# Patient Record
Sex: Female | Born: 1960 | Race: Black or African American | Hispanic: No | Marital: Married | Smoking: Never smoker
Health system: Southern US, Community
[De-identification: ages and names within clinical notes are randomized; demographics above are authoritative.]

## PROBLEM LIST (undated history)

## (undated) DIAGNOSIS — I10 Essential (primary) hypertension: Secondary | ICD-10-CM

## (undated) HISTORY — PX: ABDOMINAL HYSTERECTOMY: SHX81

---

## 2016-01-26 NOTE — Nursing Note (Signed)
Nursing Discharge Summary - Text       Nursing Discharge Summary Entered On:  01/26/2016 20:33 EST    Performed On:  01/26/2016 20:32 EST by Dorette Grate, RN, Alwyn Ren               DC Information   Discharge To, Anticipated :   Home independently   Mode of Discharge :   Ambulatory   Dorette Grate RN, Alwyn Ren - 01/26/2016 20:32 EST   Education   Responsible Learner(s) :   No Data Available     Teaching Method :   Explanation, Printed materials   CORPOLONGO, RN, STEPHANIE B - 01/26/2016 20:32 EST   Post-Hospital Education Adult Grid   Importance of Follow-Up Visits :   Verbalizes understanding   Plan of Care :   Verbalizes understanding   When to Call Health Care Provider :   [facility] understanding   CORPOLONGO, RN, Alwyn Ren - 01/26/2016 20:32 EST   Medication Education Adult Grid   Med Dosage, Route, Scheduling :   Verbalizes understanding   Medication Precautions :   Verbalizes understanding   CORPOLONGO, RN, STEPHANIE B - 01/26/2016 20:32 EST

## 2016-01-26 NOTE — Nursing Note (Signed)
Orthostatics - Text       Orthostatics Entered On:  01/26/2016 20:00 EST    Performed On:  01/26/2016 19:57 EST by CORPOLONGO, RN, STEPHANIE B               Orthostatics   Systolic/Diastolic  Supine BP :   126 mmHg   Systolic/Diastolic  Supine BP :   82 mmHg   Pulse Supine :   105 bpm   Systolic/Diastolic  Standing BP :   140 mmHg   Systolic/Diastolic  Standing BP :   90 mmHg   Pulse Standing :   111 bpm   Systolic/Diastolic  Sitting BP :   135 mmHg   Systolic/Diastolic  Sitting BP :   90 mmHg   Pulse Sitting :   104 bpm (HI)    Comments :   Not lightheaded or dizzy when standing or sitting up.   Dorette Grate RN, STEPHANIE B - 01/26/2016 19:57 EST

## 2016-03-17 NOTE — Nursing Note (Signed)
Nursing Discharge Summary - Text       Nursing Discharge Summary Entered On:  03/17/2016 15:17 EDT    Performed On:  03/17/2016 15:17 EDT by CABE, RN, ADAM Z               DC Information   Discharge To, Anticipated :   Home with family support   Mode of Discharge :   Ambulatory   Transportation :   Private vehicle   Accompanied By :   Eyvonne MechanicFriend   CABE, RN, ADAM Z - 03/17/2016 15:17 EDT   Education   Responsible Learner(s) :   No Data Available     Barriers To Learning :   None evident   Teaching Method :   Demonstration, Explanation, Printed materials   CABE, RN, ADAM Z - 03/17/2016 15:17 EDT   Post-Hospital Education Adult Grid   Activity Expectations :   Verbalizes understanding   Diagnostic Results :   Verbalizes understanding   Disease Process :   Verbalizes understanding   Importance of Follow-Up Visits :   Verbalizes understanding   Invasive Line Care :   Verbalizes understanding   Plan of Care :   Verbalizes understanding   When to Call Health Care Provider :   Verbalizes understanding   CABE, RN, ADAM Z - 03/17/2016 15:17 EDT   Medication Education Adult Grid   Drug to Drug Interactions :   IT sales professionalVerbalizes understanding   Safety, Medication :   Verbalizes understanding   CABE, RN, ADAM Z - 03/17/2016 15:17 EDT   Additional Learner(s) Present :   Eyvonne MechanicFriend   CABE, RN, ADAM Z - 03/17/2016 15:17 EDT

## 2016-11-07 ENCOUNTER — Encounter (HOSPITAL_BASED_OUTPATIENT_CLINIC_OR_DEPARTMENT_OTHER): Payer: Self-pay | Admitting: *Deleted

## 2016-11-07 DIAGNOSIS — I10 Essential (primary) hypertension: Secondary | ICD-10-CM | POA: Insufficient documentation

## 2016-11-07 DIAGNOSIS — Z79899 Other long term (current) drug therapy: Secondary | ICD-10-CM | POA: Insufficient documentation

## 2016-11-07 DIAGNOSIS — R509 Fever, unspecified: Secondary | ICD-10-CM | POA: Diagnosis present

## 2016-11-07 DIAGNOSIS — J069 Acute upper respiratory infection, unspecified: Secondary | ICD-10-CM | POA: Insufficient documentation

## 2016-11-07 NOTE — ED Triage Notes (Signed)
Cough x 1 week.  No fever in 4-5 days.  Reports clear nasal drainage.

## 2016-11-08 ENCOUNTER — Emergency Department (HOSPITAL_BASED_OUTPATIENT_CLINIC_OR_DEPARTMENT_OTHER)

## 2016-11-08 ENCOUNTER — Emergency Department (HOSPITAL_BASED_OUTPATIENT_CLINIC_OR_DEPARTMENT_OTHER)
Admission: EM | Admit: 2016-11-08 | Discharge: 2016-11-08 | Disposition: A | Discharge status: Home / Self Care | Attending: Emergency Medicine | Admitting: Emergency Medicine

## 2016-11-08 DIAGNOSIS — J069 Acute upper respiratory infection, unspecified: Secondary | ICD-10-CM

## 2016-11-08 HISTORY — DX: Essential (primary) hypertension: I10

## 2016-11-08 MED ORDER — BENZONATATE 100 MG PO CAPS
100.0000 mg | ORAL_CAPSULE | Freq: Three times a day (TID) | ORAL | 0 refills | Status: AC | PRN
Start: 2016-11-08 — End: ?

## 2016-11-08 MED ORDER — MUPIROCIN CALCIUM 2 % EX CREA: 1.0000 "application " | TOPICAL_CREAM | Freq: Two times a day (BID) | CUTANEOUS | 0 refills | Status: AC

## 2016-11-08 NOTE — Discharge Instructions (Addendum)
You may apply Bactroban to the small lesion next to your nose twice a day for the next week. Please do not scratch this area, squeeze this area. You may alternate Tylenol and Motrin for fever and pain. Your chest x-ray showed no sign of pneumonia. This is likely a virus and will continue to run its course. You do not need antibiotics at this time.

## 2016-11-08 NOTE — ED Provider Notes (Signed)
By signing my name below, I, Doreatha Martinva Mathews, attest that this documentation has been prepared under the direction and in the presence of Malkia Nippert N Hannah Crill, DO. Electronically Signed: Doreatha MartinEva Mathews, ED Scribe. 11/08/16. 12:27 AM.   TIME SEEN: 12:21 AM   CHIEF COMPLAINT:  Chief Complaint  Patient presents with  . Fever     HPI:  HPI Comments: Gabriella Ross is a 55 y.o. female with h/o HTN who presents to the Emergency Department complaining of persistent, intermittent productive cough with green phlegm x 4 days with associated sore throat, rhinorrhea, subjective fever. Pt also complains of an area of irritation and itching on the left side of her nose, which she attributes to blowing her nose frequently. Pt states she has taken 81 mg ASA, but no ibuprofen or Tylenol today. No worsening factors noted. Pt received a flu shot this year. No known sick contacts with similar symptoms.  She denies vomiting, diarrhea. No headache, neck pain or neck stiffness. No rash.  ROS: See HPI Constitutional: + subjective fever  Eyes: no drainage  ENT: + runny nose, sore throat   Cardiovascular:  no chest pain  Resp: no SOB. + cough  GI: no vomiting GU: no dysuria Integumentary: no rash  Allergy: no hives  Musculoskeletal: no leg swelling  Neurological: no slurred speech ROS otherwise negative  PAST MEDICAL HISTORY/PAST SURGICAL HISTORY:  Past Medical History:  Diagnosis Date  . Hypertension     MEDICATIONS:  Prior to Admission medications   Medication Sig Start Date End Date Taking? Authorizing Provider  amLODipine (NORVASC) 10 MG tablet Take 5 mg by mouth daily.   Yes Historical Provider, MD  pravastatin (PRAVACHOL) 20 MG tablet Take 20 mg by mouth daily.   Yes Historical Provider, MD  spironolactone (ALDACTONE) 25 MG tablet Take 25 mg by mouth daily.   Yes Historical Provider, MD    ALLERGIES:  Allergies  Allergen Reactions  . Penicillins     SOCIAL HISTORY:  Social History  Substance Use  Topics  . Smoking status: Never Smoker  . Smokeless tobacco: Never Used  . Alcohol use No    FAMILY HISTORY: History reviewed. No pertinent family history.  EXAM: BP 150/89 (BP Location: Left Arm)   Pulse 82   Temp 97.8 F (36.6 C) (Oral)   Resp 18   Ht 5\' 8"  (1.727 m)   Wt 200 lb (90.7 kg)   SpO2 99%   BMI 30.41 kg/m  CONSTITUTIONAL: Alert and oriented and responds appropriately to questions. Well-appearing; well-nourished HEAD: Normocephalic EYES: Conjunctivae clear, PERRL, EOMI ENT: normal nose; no rhinorrhea; moist mucous membranes. No pharyngeal erythema or petechiae, no tonsillar hypertrophy or exudate, no uvular deviation, no unilateral swelling, no trismus or drooling, no muffled voice, normal phonation, no stridor, no dental caries present, no drainable dental abscess noted, no Ludwig's angina, tongue sits flat in the bottom of the mouth, no angioedema, no facial erythema or warmth, no facial swelling; no pain with movement of the neck; 1 cm scabbed area to the left side of the nose with minimal surrounding erythema and no warmth, fluctuance, drainage, induration NECK: Supple, no meningismus, no nuchal rigidity, no LAD  CARD: RRR; S1 and S2 appreciated; no murmurs, no clicks, no rubs, no gallops RESP: Normal chest excursion without splinting or tachypnea; breath sounds clear and equal bilaterally; no wheezes, no rhonchi, no rales, no hypoxia or respiratory distress, speaking full sentences ABD/GI: Normal bowel sounds; non-distended; soft, non-tender, no rebound, no guarding, no peritoneal signs,  no hepatosplenomegaly BACK:  The back appears normal and is non-tender to palpation, there is no CVA tenderness EXT: Normal ROM in all joints; non-tender to palpation; no edema; normal capillary refill; no cyanosis, no calf tenderness or swelling    SKIN: Normal color for age and race; warm; no rash NEURO: Moves all extremities equally, sensation to light touch intact diffusely,  cranial nerves II through XII intact, normal speech PSYCH: The patient's mood and manner are appropriate. Grooming and personal hygiene are appropriate.  MEDICAL DECISION MAKING: Patient here with subjective fever, cough the past several days. They were concerned because patient is here visiting family and her grandson recently had heart surgery and is still in the hospital. Patient's daughter does not want to get sick and give symptoms to her grandson. Will obtain chest x-ray to evaluate for pneumonia but suspect viral illness. This have a small lesion noted to the left face but no obvious sign of abscess or facial cellulitis. This does not look like shingles. No other rash or lesions noted. Will put patient on topical Bactroban. She denies any pain at this time.  ED PROGRESS: Patient's chest x-ray shows no sign of infiltrate, edema or pneumothorax. No pleural effusion. I feel she is safe to be discharged home. Suspect viral illness. Recommended alternating Tylenol and Motrin for fever, pain. We'll discharge with Bactroban to place on this lesion next to her nose the next week twice a day. Discussed with patient and daughter return precautions. Recommended good hand hygiene at home and avoiding grandchild until symptoms have resolved.  At this time, I do not feel there is any life-threatening condition present. I have reviewed and discussed all results (EKG, imaging, lab, urine as appropriate) and exam findings with patient/family. I have reviewed nursing notes and appropriate previous records.  I feel the patient is safe to be discharged home without further emergent workup and can continue workup as an outpatient as needed. Discussed usual and customary return precautions. Patient/family verbalize understanding and are comfortable with this plan.  Outpatient follow-up has been provided. All questions have been answered.    I personally performed the services described in this documentation, which was  scribed in my presence. The recorded information has been reviewed and is accurate.    Layla MawKristen N Tracy Kinner, DO 11/08/16 [phone]

## 2016-11-08 NOTE — ED Notes (Signed)
Pt given d/c instructions as per chart. Verbalizes understanding. No questions. Rx x 2. 

## 2017-05-08 IMAGING — CR DG CHEST 2V
2 series · 2 of 2 positions shown · non-contrast
Comparison: None.

CLINICAL DATA: Acute onset of cough and congestion. Burning at the
chest. Slight headache and dizziness. Initial encounter.

EXAM:
CHEST  2 VIEW

[w chest pa]
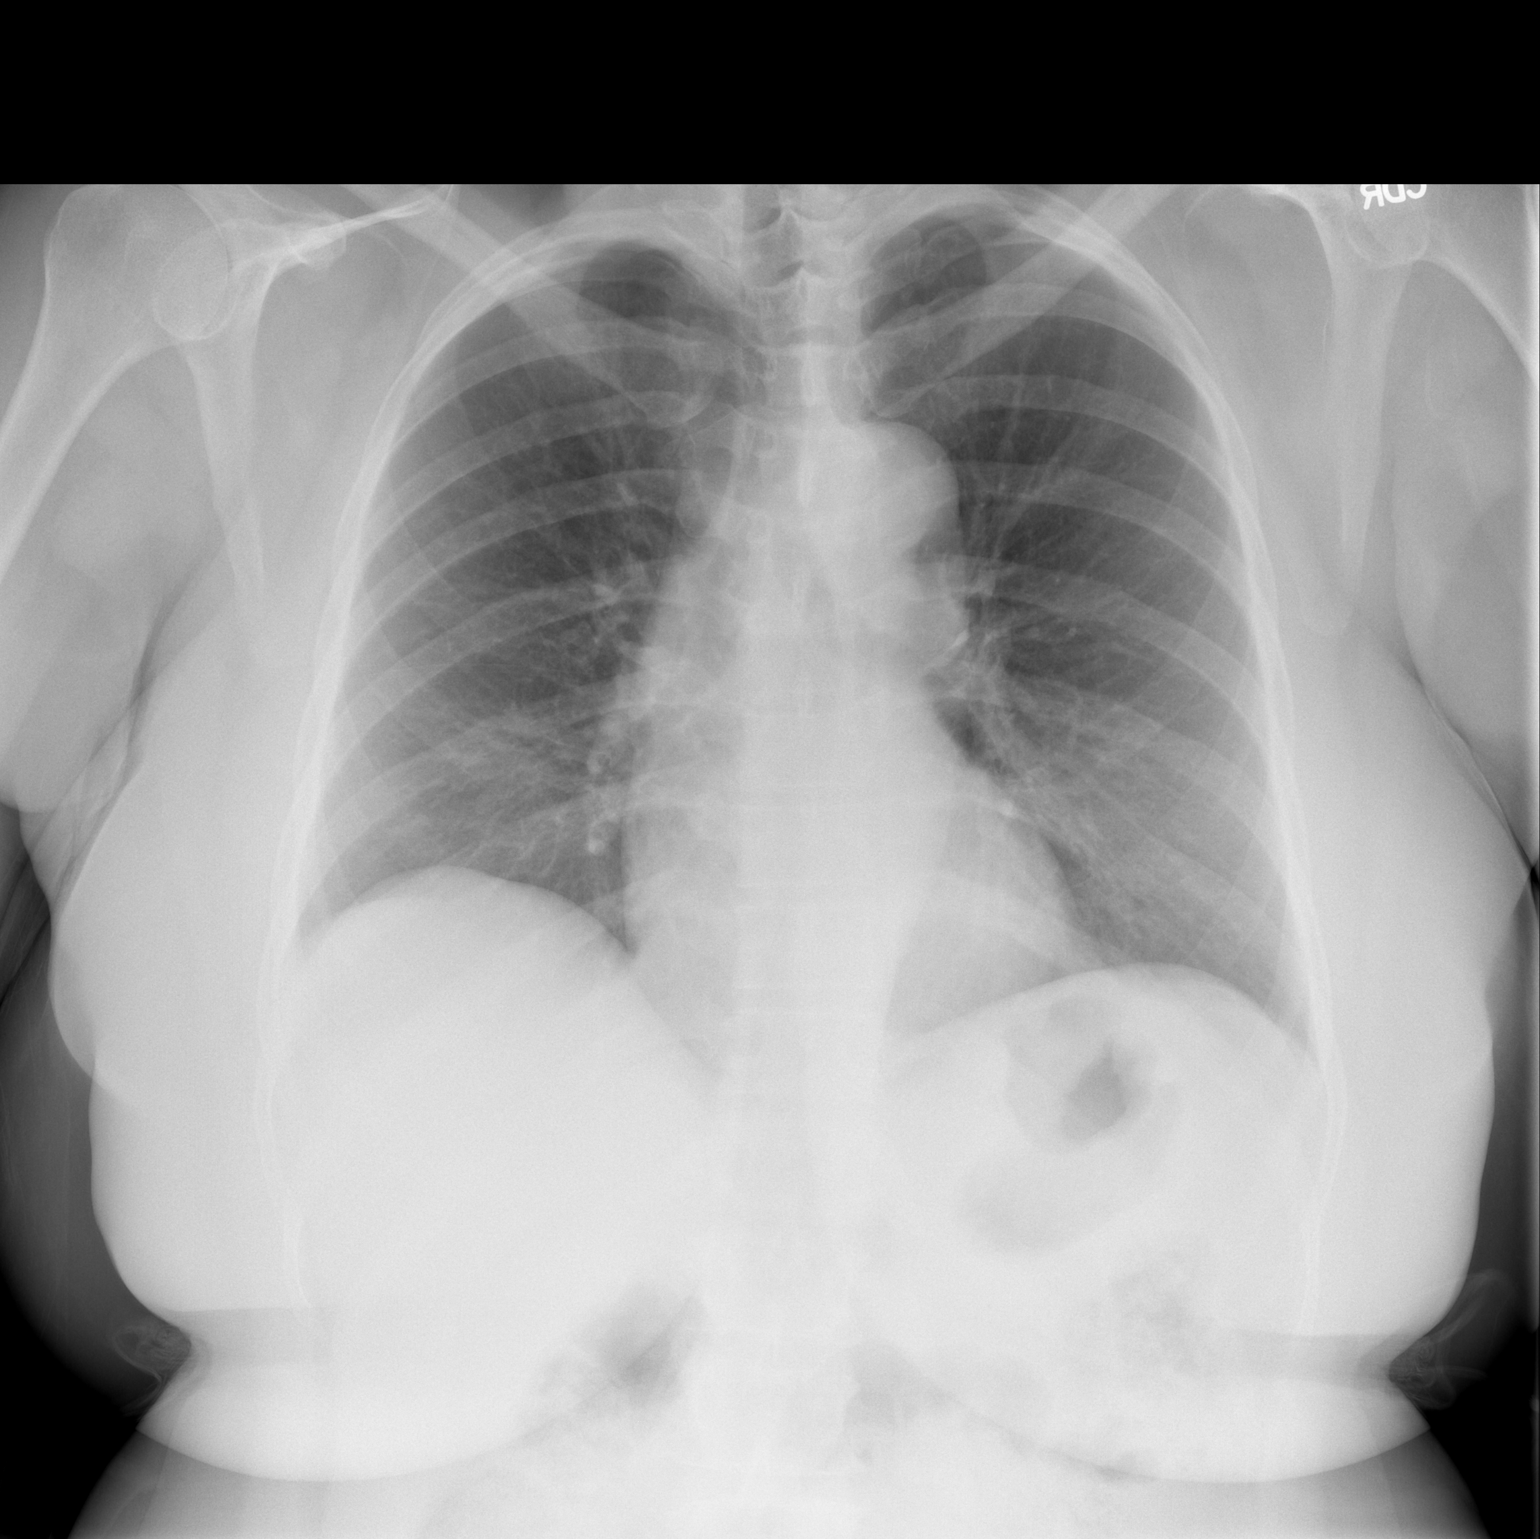

[w chest lat]
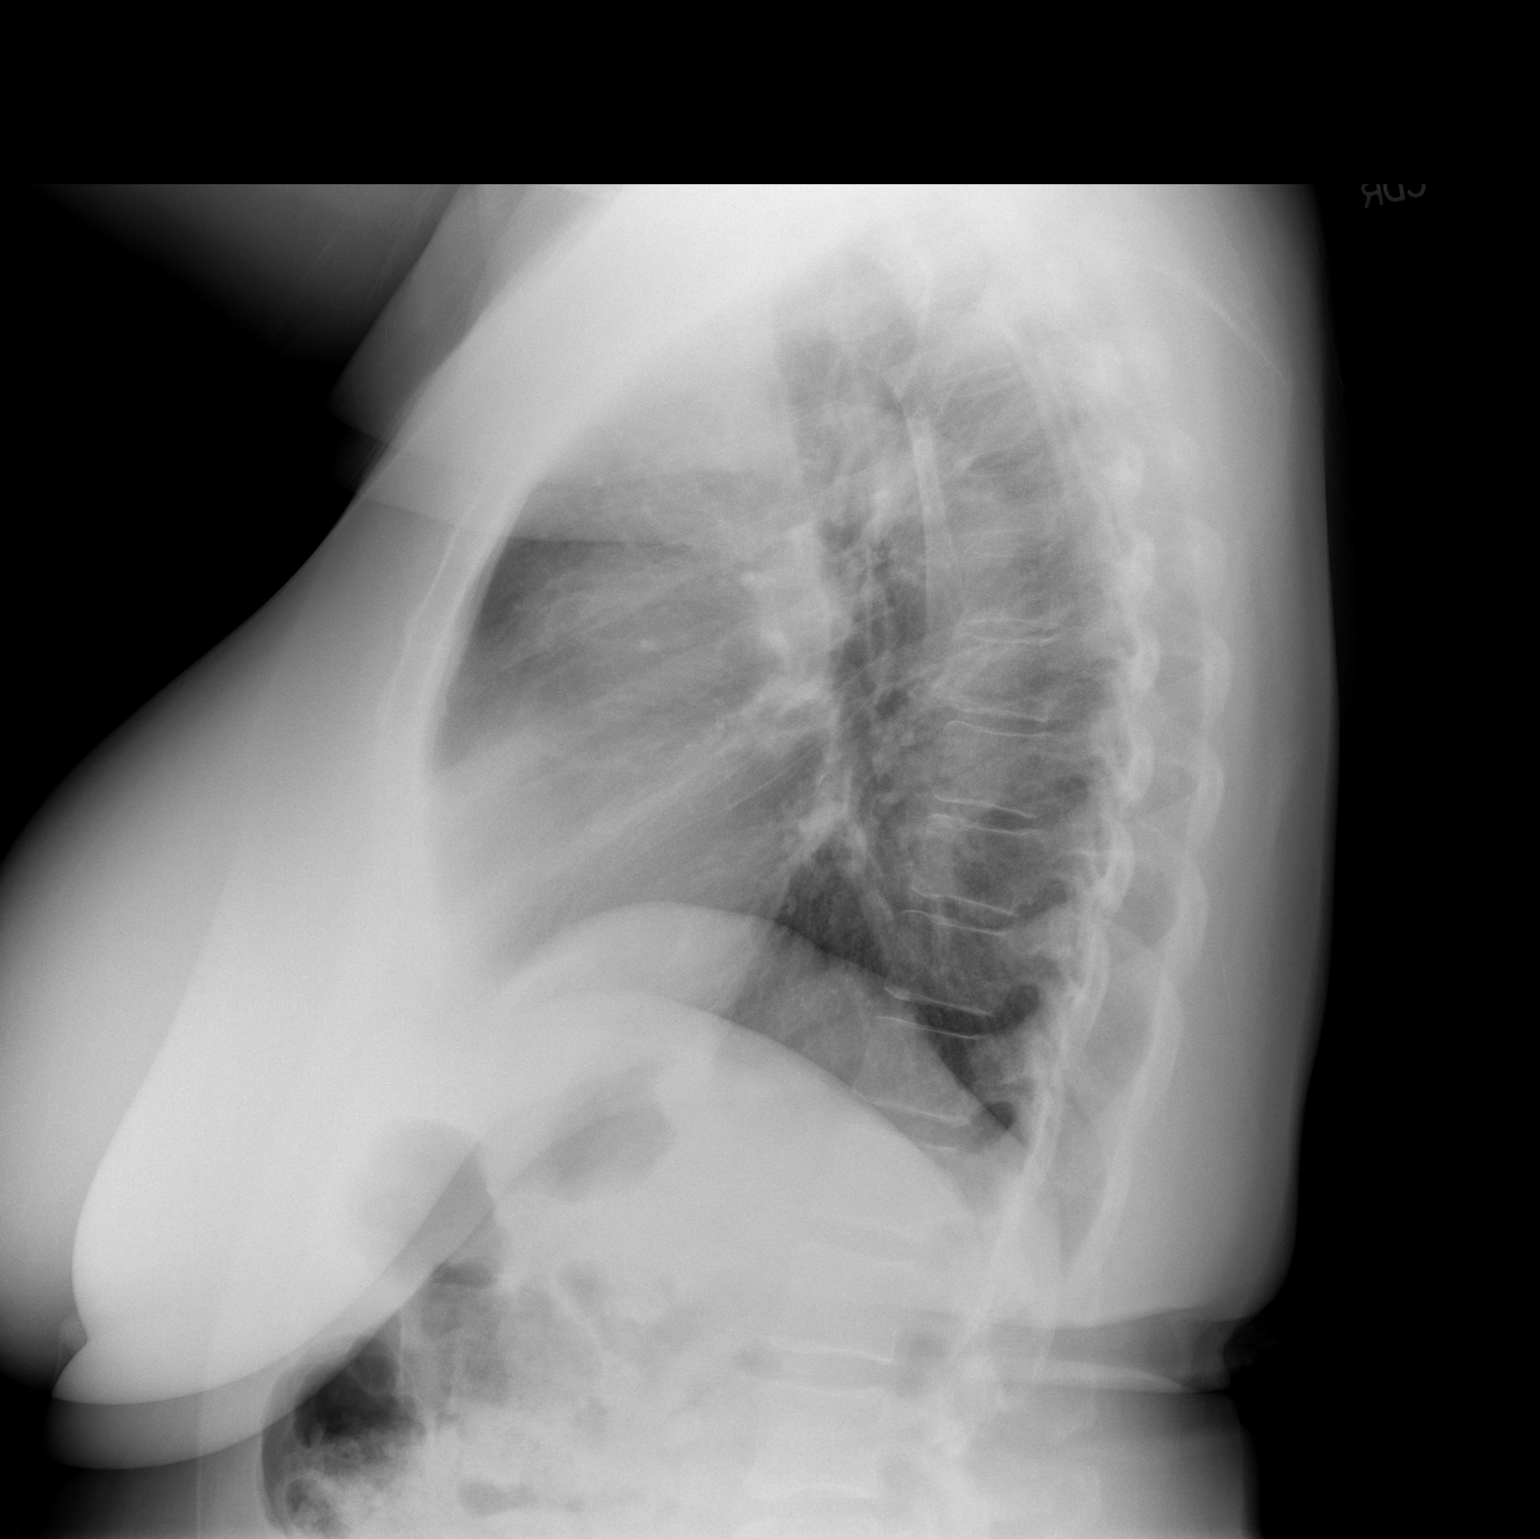

[2 of 2 positions shown; findings below may reference images not displayed]

FINDINGS: The lungs are well-aerated and clear. There is no evidence of focal
opacification, pleural effusion or pneumothorax.

The heart is normal in size; the mediastinal contour is within
normal limits. No acute osseous abnormalities are seen.
IMPRESSION: No acute cardiopulmonary process seen.

## 2023-06-01 ENCOUNTER — Encounter: Payer: PRIVATE HEALTH INSURANCE | Attending: Orthopaedic Surgery

## 2023-07-04 NOTE — Telephone Encounter (Signed)
Patient would like a call back about this appt as she was not made aware of it. Thank you

## 2023-07-12 ENCOUNTER — Encounter: Payer: PRIVATE HEALTH INSURANCE | Attending: Student in an Organized Health Care Education/Training Program

## 2023-07-29 LAB — HEMOGLOBIN A1C: Hemoglobin A1C, External: 5.6 % (ref 4.3–5.6)

## 2023-08-22 ENCOUNTER — Ambulatory Visit
Admit: 2023-08-22 | Discharge: 2023-08-22 | Payer: PRIVATE HEALTH INSURANCE | Attending: Student in an Organized Health Care Education/Training Program

## 2023-08-22 DIAGNOSIS — M533 Sacrococcygeal disorders, not elsewhere classified: Secondary | ICD-10-CM

## 2023-08-22 NOTE — Progress Notes (Signed)
 Carmen Casa, MD  Orthopaedic Surgery & Sports Medicine  [id number]    Name: Carmen Miller  DOB: [birth year]  MRN: [id number]    [facility] SURGERY CLINIC NOTE    Chief Complaint   Patient presents with    Injury     IME: Right Hip       HPI   08/22/23  Carmen Miller is a 62 y.o. female presenting for IME.    Record Review:   All supplied records were reviewed.  Pertinent information summarized below.  Patient was referred for evaluation of her right hip.  Initial injury occurred in November 06, 2022.  She was making a delivery on a porch and there was a rotten board and her right leg went through the board up to her groin.  At that time, per records she noted pain to the low back, left wrist, left knee.  She went to a med care facility where x-rays were obtained.  Patient was started on naproxen and initiated in physical therapy.  She feels that the physical therapy did not help.  She did eventually get some improvement in the knee pain in the wrist pain but as of March 2024 continued to note right low back pain sometimes radiating to the posterior right thigh.  She has been on work restrictions.  Driving reported to be painful due to prolonged sitting and putting pressure on the right side.  Of note, there is indication for records of a prior 2017 work accident involving injuries to her low back, neck, right hip.  MRI of the lumbar spine and of the right hip open obtained.    History obtained from the patient today:  Patient today states it is not my hip that hurts, I do not know why people keep saying that and points to the source of her pain of her right low back.  She states that the pain has been here for quite some time and has not improved with the treatments rendered thus far including naproxen and gabapentin and a course of physical therapy.  No radiation of pain down the leg. She states that bending activities are the worst with pain in that right low back, but she is able to ambulate for a few hours  a day and walking is better tolerated than bending or prolonged sitting.    No current outpatient medications on file.    No past medical history on file.     No past surgical history on file.    No family history on file.           PHYSICAL EXAM   Right hip:  Skin is intact.   Range of motion includes forward flexion to 110, external rotation to 30, internal rotation to 20.   No pain with forward flexion.   No pain with FADIR.   No pain with FABER.   No snapping hip appreciated.   No pain with resisted hip flexion or resisted adduction.   Strength is 5 out of 5 in hip flexion, abduction, adduction.   Neurovascularly intact distally.     SI Joint Exam:  Positive SI joint tenderness  Positive Fortin's finger test  Positive Gaenslen's test  Negative SI compression test  Negative SI distraction test  Patrick's test positive for SI pain      IMAGING     First Name: Carmen   Last Name: Miller   Date of Birth: 1961/04/19   Gender: F   MRN: [id number]  Exam Date: 25-Apr-2023 8:36:07 AM   Exam Description: MR Right Hip Without Contrast   Referring Physician: Ancel Miller   Reading Physician(s):    Report Date: 25-Apr-2023 8:30:00 AM   Completion Status: COMPLETE   Verification Status: VERIFIED   PATIENT NAME: Carmen, Miller  DOB: 1961-08-31  MRN: [id number]  PHONE:[phone]  PHYSICIAN: Miller Ancel, MD  EXAM DATE: 04/25/2023  EXAM: MR Right Hip Without Contrast  REASON FOR EXAM: M25.551 - Pain in right hip  *-*-*  TECHNIQUE: Multiplanar multisequence MRI images of the right hip, 3.0 Tesla  MRI.    COMPARISON: None.    FINDINGS: The femoral heads are rounded in shape and seated in the  acetabulum. No femoral neck stress fracture. No avascular necrosis of  either hip.    Mild to moderate primary osteoarthrosis (OA) of both hips. Low grade  chondromalacia of the anterolateral margin of the right acetabulum. There  are degenerative changes of the pubic symphysis.    Mild degenerative change of the sacroiliac joints,  partially seen. No  significant bone marrow edema the pelvis to suggest stress fracture.    The lateral center edge angle of the right hip is normal at 32 degrees. 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    There is fraying of the superior and anterosuperior aspect of the right  acetabular labrum. No displaced labral tear.    Mild tendinosis of the right gluteus minimus and medius tendons. Trace right  greater trochanteric bursitis. Intact iliopsoas and rectus femoris tendon.    Tendinosis and age indeterminate low grade intrasubstance partial-thickness  tear both hamstring tendons.    The intrapelvic structures are only partially seen. There has been a prior  hysterectomy.      IMPRESSION:    1. Mild to moderate primary OA of both hips. Degenerative fraying of the  superior and anterosuperior aspect of the right acetabular labrum. No  displaced labral tear.    2. Tendinosis of the right gluteus minimus and medius tendons. Trace right  greater trochanteric bursitis.    3. Tendinosis and age indeterminate low grade intrasubstance  partial-thickness tear of both the hamstring tendons.    4. There are degenerative change of the pubic symphysis and mild  degenerative changes of both sacroiliac joints.    *-*-*   THIS IS AN ELECTRONICALLY VERIFIED REPORT   04/26/2023 8:46 AM: [name], DO  [name], DO  [phone]  DEO/deo  DD: 04/26/2023 08:27 am  DT: 04/26/2023 08:46 am  Accession #: [id number]  Radiology  COPY PAGE 1 of 1  Finding         First Name: Carmen   Last Name: Miller   Date of Birth: 1961/10/10   Gender: F   MRN: [id number]   Exam Date: 04-Mar-2023 4:15:20 PM   Exam Description: MR-Lumbar Spine without contrast   Referring Physician: Ancel Miller   Reading Physician(s):    Report  Date: 04-Mar-2023 4:30:00 PM   Completion Status: COMPLETE   Verification Status: VERIFIED   PATIENT NAME: Carmen, Miller  DOB: 26-Mar-1961  MRN: [id number]  PHONE:[phone]  PHYSICIAN: Miller Ancel, MD  EXAM DATE: 03/04/2023  EXAM: MR-Lumbar Spine without contrast  REASON FOR EXAM: S39.012A - Strain of muscle, fascia and tendon of lower  back, initial encounter /  S40.011A - Contusion of right shoulder, initia  *-*-*  TECHNIQUE: Multiplanar multisequence images lumbar spine without contrast.    COMPARISON: None.    FINDINGS: Soft tissues: Several tiny renal cysts.. Alignment and bony  structures: Preserved alignment and marrow. Mild disc desiccation. T11-12  mild disc bulge without stenosis. Conus medullaris: Normal signal and    caliber. Findings by level:    T12/L1: Mild disc bulge and facet hypertrophy. No stenosis.    L1/2: Minimal disc bulge. Mild facet hypertrophy. No stenosis.    L2/3: Mild disc bulge biforaminally with right extraforaminal annular tear.    Mild facet hypertrophy. Shallow right extraforaminal disc/osteophyte  complex. Mild right exit narrowing.    L3/4: Shallow left foraminal disc osteophyte complex with extraforaminal  annular tear. Mild facet hypertrophy. Mild central narrowing.    L4/5: Mild disc bulge with left extraforaminal annular tear. Mild facet  hypertrophy. Mild exit and moderate central stenosis. Mild recess narrowing.    L5/S1: Mild left paracentral disc bulge. Mild facet hypertrophy. Mild left  exit narrowing.    IMPRESSION: Diffuse spondylosis is mostly mild, with L4-5 moderate central  stenosis. Several annular tears. No neural impingement.    *-*-*   THIS IS AN ELECTRONICALLY VERIFIED REPORT   03/07/2023 11:08 AM: Lani JINNY Heater, MD  Lani JINNY Heater, MD  TJM/tjm  DD: 03/07/2023 11:04 am  DT: 03/07/2023 11:08 am  Accession #: [id number]  Radiology  COPY PAGE 1 of 1  Finding      ASSESSMENT & PLAN   Carmen Miller is a 62 y.o. female who presents with chronic right low back  pain since a work injury in December 2023. History, exam, and imaging findings are consistent with the following diagnosis/diagnoses:     1. Pain of right sacroiliac joint    2. Lumbar disc disease    3. Chronic right-sided low back pain without sciatica    4. Primary osteoarthritis of right hip    5. Gluteal tendonitis of right buttock      This visit was for the purpose of an IME encounter only.  No treatment was rendered. All statements made are with a reasonable degree of medical certainty. Recommendations are made below.     62 year old female with chronic right-sided low back pain since a work-related injury in December 2023 when she fell through a rotten wood board on a porch.  She was referred to me today for evaluation of right hip pain.  She has had treatment thus far consisting of physical therapy as well as activity modifications with work restrictions and a course of naproxen.      Upon history and exam of the patient today, her pain is focal to the right low back, more specifically focal to the SI joint.  I think this is the primary source of her pain.  She was referred to me for evaluation of her right hip.  I have examined her hip and reviewed the MRI findings.  The MRI reveals some chronic conditions to the hip including arthritis, gluteal tendinitis, and bursitis, but none of those conditions are symptomatic to the patient at present based off the location of her pain and her physical exam findings today.  Rather, I think that the primary etiology of her pain is her low back/SI joint/lumbar spine.    From the perspective of the hip, no further treatment is needed as she has not symptomatic from her hip pathologies.  However, for further treatment, I would recommend referral  to a spine specialist for evaluation and treatment of her lumbar spine/SI joints.     Given that I think the primary etiology is back/spine related rather than due to intrinsic hip pathologies, I am unable to comment on this  time in terms of causation or further medical treatments and would defer that to the spine specialist.  From the hip perspective, no further treatment is needed and the patient is at MMI from her hip with 0% rating, please note that this is specific to the hip and does not entail any impairment related to the spine/back/SI joint which I am deferring to a spine specialist.  No permanent work restrictions in regards to the hip.    During our visit today, there were no signs or indications of symptom magnification.    I am referring the patient back to the primary team with the above recommendations.            This document was created with voice recognition software.  Please excuse any errors and feel free to contact me if any questions or concerns.      Carmen Casa, MD  Orthopaedic Surgery & Sports Medicine

## 2023-08-22 NOTE — Progress Notes (Signed)
 Documentation of visit with nurse case manager to discuss the details of this patient's care at today's visit.  Time spent with NCM = 2 minutes.

## 2023-09-12 ENCOUNTER — Encounter: Admit: 2023-09-12 | Discharge: 2023-09-12 | Payer: PRIVATE HEALTH INSURANCE | Attending: Neurological Surgery

## 2023-09-12 DIAGNOSIS — M519 Unspecified thoracic, thoracolumbar and lumbosacral intervertebral disc disorder: Secondary | ICD-10-CM

## 2023-09-12 NOTE — Progress Notes (Signed)
Carmen Miller   17-Oct-1961       Referring physician: No ref. provider found     Reason for visit:   Chief Complaint   Patient presents with    Back Pain      Hx: PVC (premature ventricular contraction), Hypertension,       Patient presents today with complaints of right low back pain sometimes radiating to the posterior right thigh as well as right hip pain   Symptoms presented 2017 work accident involving injuries to her low back, neck, right hip   Describes pain as    Pain increases      Pain decreases with    Pain level today is /10    Patient denies/ admits weakness and radiating pain.     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  She states that her right upper extremity actually fell through the wooden board.  She reports initial lower back pain and right buttock and hip pain.  Patient was initially evaluated referred to [facility].  Initially she had 2 months of physical therapy but stopped in March.  Subsequently a lumbar MRI scan was performed in March suggestive of spinal stenosis at L4-5.  Patient has not tried injections or any chiropractor treatment.  She subsequently followed up with Dr. Delford Field with orthopedics had a right hip MRI scan performed which demonstrated some degenerative changes but there was no surgical recommendations.  She complains today primarily of a right sided lower back pain that rates in the right buttock and at times the proximal posterior right lower extremity.  She reports no radicular complaints past her knee into the foot.   She denies symptoms on the left.  She denies any saddle anesthesia no difficulties with bowel or bladder function.  She denies previous spinal surgery.                      Allergies   Allergen Reactions    Ace Inhibitors Anaphylaxis, Swelling and Other (See Comments)     Other Reaction(s): SWELLING THROAT    Event:      Occurred 2011    Other reaction(s): Unknown   pt state get swollen thoat<br/>Reaction(s): Unknown; Note: pt state get swollen thoat    pt state get swollen thoat<br/>Reaction(s): Unknown; Note: pt state get swollen thoat    Clindamycin Anaphylaxis     Other Reaction(s): THROAT SWELLING    Throat swelling     Throat swelling    Penicillins Rash and Other (See Comments)     Other Reaction(s): ANAPHYLAXIS    Other reaction(s): Unknown   itching per pt,3.17.15 mvm<br/>Reaction(s): Unknown; Note: itching per pt,3.17.15 mvm    itching per pt,3.17.15 mvm<br/>Reaction(s): Unknown; Note: itching per pt,3.17.15 mvm       No past medical history on file.     No past surgical history on file.      Current Outpatient Medications:  atorvastatin (LIPITOR) 20 MG tablet, Take with food/milk.Take or use exactly as directed.Obtain advice for OTCs.Do not take if pregnant.Avoid grapefruit and grapefruit juice., Disp: , Rfl:     ezetimibe (ZETIA) 10 MG tablet, Take or use exactly as directed.Obtain advice for OTCs.Check with your doctor before becoming pregnant., Disp: , Rfl:     lidocaine (LIDODERM) 5 %, For external use.Remove old patch prior to applying a new patch., Disp: , Rfl:     loratadine (CLARITIN) 10 MG tablet, May cause drowsiness.Obtain advice for OTCs., Disp: , Rfl:     naproxen (NAPROSYN) 500 MG tablet, Take with food/milk.Obtain advice for OTCs.May cause drowsiness/dizziness.Check with your doctor before becoming pregnant., Disp: , Rfl:     spironolactone (ALDACTONE) 25 MG tablet, Take or use exactly as directed.May cause drowsiness/dizziness., Disp: , Rfl:     tiZANidine (ZANAFLEX) 2 MG tablet, May  cause drowsiness.Take or use exactly as directed.May impair driving., Disp: , Rfl:      Objective:  There is no height or weight on file to calculate BMI.     Physical Exam:  On exam today she is a pleasant African-American female she is unaccompanied she demonstrates normal mood and affect answers questions follows commands.  She appears a little uncomfortable today has to sit more on her left side due to pain.  Inspection of her lower back shows no gross deformities with good alignment.  There was some tenderness along the right SI joint and right lumbar paraspinals and the right sciatic notch.  She had a negative Faber test on the left, some discomfort on the right.  Negative seated straight leg raise sign on the left, some pain with testing the right.  She had pain-free hip range of motion bilaterally.  She had good muscle tone of both lower extremities, 5\5 motor strength with intact sensation distally.  Distal pulses were intact.  Reflexes were symmetric patella 1\2 bilaterally.  Achilles 1\2 bilaterally.  No Hoffmann sign in the upper extremities.  She had intact bilateral upper extremity strength.  Biceps triceps reflexes 1\2 bilaterally.    Radiological Findings:  Her lumbar MRI scan was reviewed dated 03/04/2023 which demonstrates multilevel degenerative changes with canal stenosis at L4-5 centrally, no high-grade neuroforaminal narrowing.  There is a small right extraforaminal disc osteophyte complex but no high-grade stenosis.      Assessment:  Problem List Items Addressed This Visit    None  Visit Diagnoses       Lumbar disc disease    -  Primary    Chronic right-sided low back pain without sciatica        Pain of right sacroiliac joint                   Plan:  Patient complains of lower back, right buttock and proximal right leg pain.  She does have some central canal stenosis at L4-5, but no high-grade stenosis no high-grade neuroforaminal stenosis.  At this time no neurosurgical recommendations.   Patient will be referred to Dr. Trixie Dredge for conservative treatments.  She is going to follow-up as needed.  Case discussed with the nurse case manager in detail.    Dr. Jule Ser on site. Discussed treatment options with the patient.
